# Patient Record
Sex: Female | Born: 1980 | Race: White | Hispanic: No | Marital: Single
Health system: Southern US, Community
[De-identification: ages and names within clinical notes are randomized; demographics above are authoritative.]

---

## 2018-11-01 ENCOUNTER — Emergency Department (HOSPITAL_COMMUNITY): Payer: BLUE CROSS/BLUE SHIELD

## 2018-11-01 ENCOUNTER — Encounter (HOSPITAL_COMMUNITY): Payer: Self-pay | Admitting: Emergency Medicine

## 2018-11-01 ENCOUNTER — Emergency Department (HOSPITAL_COMMUNITY)
Admission: EM | Admit: 2018-11-01 | Discharge: 2018-11-01 | Disposition: A | Discharge status: Home / Self Care | Payer: BLUE CROSS/BLUE SHIELD | Attending: Emergency Medicine | Admitting: Emergency Medicine

## 2018-11-01 ENCOUNTER — Other Ambulatory Visit: Payer: Self-pay

## 2018-11-01 DIAGNOSIS — H4711 Papilledema associated with increased intracranial pressure: Secondary | ICD-10-CM | POA: Insufficient documentation

## 2018-11-01 DIAGNOSIS — H4710 Unspecified papilledema: Secondary | ICD-10-CM

## 2018-11-01 DIAGNOSIS — G932 Benign intracranial hypertension: Secondary | ICD-10-CM

## 2018-11-01 LAB — CBC WITH DIFFERENTIAL/PLATELET
ABS IMMATURE GRANULOCYTES: 0.01 10*3/uL (ref 0.00–0.07)
BASOS PCT: 1 %
Basophils Absolute: 0 10*3/uL (ref 0.0–0.1)
EOS ABS: 0.2 10*3/uL (ref 0.0–0.5)
EOS PCT: 3 %
HCT: 38.5 % (ref 36.0–46.0)
Hemoglobin: 12.5 g/dL (ref 12.0–15.0)
Immature Granulocytes: 0 %
Lymphocytes Relative: 28 %
Lymphs Abs: 1.7 10*3/uL (ref 0.7–4.0)
MCH: 31.1 pg (ref 26.0–34.0)
MCHC: 32.5 g/dL (ref 30.0–36.0)
MCV: 95.8 fL (ref 80.0–100.0)
MONO ABS: 0.4 10*3/uL (ref 0.1–1.0)
Monocytes Relative: 6 %
NEUTROS ABS: 3.8 10*3/uL (ref 1.7–7.7)
Neutrophils Relative %: 62 %
PLATELETS: 181 10*3/uL (ref 150–400)
RBC: 4.02 MIL/uL (ref 3.87–5.11)
RDW: 10.9 % — AB (ref 11.5–15.5)
WBC: 6.1 10*3/uL (ref 4.0–10.5)
nRBC: 0 % (ref 0.0–0.2)

## 2018-11-01 LAB — COMPREHENSIVE METABOLIC PANEL
ALBUMIN: 3.5 g/dL (ref 3.5–5.0)
ALT: 46 U/L — AB (ref 0–44)
AST: 39 U/L (ref 15–41)
Alkaline Phosphatase: 40 U/L (ref 38–126)
Anion gap: 6 (ref 5–15)
BUN: 6 mg/dL (ref 6–20)
CALCIUM: 8.7 mg/dL — AB (ref 8.9–10.3)
CO2: 27 mmol/L (ref 22–32)
Chloride: 105 mmol/L (ref 98–111)
Creatinine, Ser: 0.83 mg/dL (ref 0.44–1.00)
GFR calc Af Amer: >60 mL/min (ref >60)
GFR calc non Af Amer: >60 mL/min (ref >60)
Glucose, Bld: 91 mg/dL (ref 70–99)
POTASSIUM: 3.8 mmol/L (ref 3.5–5.1)
Sodium: 138 mmol/L (ref 135–145)
TOTAL PROTEIN: 6.4 g/dL — AB (ref 6.5–8.1)
Total Bilirubin: 1.1 mg/dL (ref 0.3–1.2)

## 2018-11-01 LAB — CSF CELL COUNT WITH DIFFERENTIAL
RBC COUNT CSF: 131 /mm3 — AB
Tube #: 1
WBC CSF: 1 /mm3 (ref 0–5)

## 2018-11-01 LAB — GLUCOSE, CSF: Glucose, CSF: 52 mg/dL (ref 40–70)

## 2018-11-01 LAB — PROTEIN, CSF: Total  Protein, CSF: 15 mg/dL (ref 15–45)

## 2018-11-01 LAB — SEDIMENTATION RATE: Sed Rate: 29 mm/hr — ABNORMAL HIGH (ref 0–22)

## 2018-11-01 MED ORDER — LORAZEPAM 2 MG/ML IJ SOLN
1.0000 mg | Freq: Once | INTRAMUSCULAR | Status: AC
  Administered 2018-11-01: 1 mg via INTRAVENOUS
  Filled 2018-11-01: qty 1

## 2018-11-01 MED ORDER — ONDANSETRON 4 MG PO TBDP: 4.0000 mg | ORAL_TABLET | ORAL | 0 refills | Status: AC | PRN

## 2018-11-01 MED ORDER — HYDROCODONE-ACETAMINOPHEN 5-325 MG PO TABS: 1.0000 | ORAL_TABLET | Freq: Four times a day (QID) | ORAL | 0 refills | Status: AC | PRN

## 2018-11-01 MED ORDER — GADOBUTROL 1 MMOL/ML IV SOLN
10.0000 mL | Freq: Once | INTRAVENOUS | Status: AC | PRN
  Administered 2018-11-01: 10 mL via INTRAVENOUS

## 2018-11-01 MED ORDER — SODIUM CHLORIDE 0.9 % IV SOLN: INTRAVENOUS | Status: DC

## 2018-11-01 MED ORDER — LIDOCAINE HCL (PF) 1 % IJ SOLN
INTRAMUSCULAR | Status: AC
  Administered 2018-11-01: 10 mL via SUBCUTANEOUS
  Filled 2018-11-01: qty 5

## 2018-11-01 NOTE — ED Notes (Signed)
Pt only complains of her eyes getting tired toward the end of the day

## 2018-11-01 NOTE — ED Notes (Signed)
Pt back from LP procedure

## 2018-11-01 NOTE — ED Provider Notes (Signed)
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  She is now pending a LP by diagnostic radiology for opening pressure.  Neurology has been consulted.  No plan at this time for formal consultation.  To recontact if questions or concerns. Physical Exam  BP (!) 108/45 (BP Location: Right Arm)   Pulse 90   Temp 99.5 F (37.5 C) (Oral)   Resp (!) 24   Ht 5\' 3"  (1.6 m)   Wt 113.4 kg   LMP 11/01/2018   SpO2 99%   BMI 44.29 kg/m   Physical Exam  ED Course/Procedures   Clinical Course as of Nov 01 2113  Thu Nov 01, 2018  2111 Patient has had lumbar puncture with radiology.  Opening pressure measured at 23 cm.  26 mL taken off with closing pressure of 6 cm.  Patient reports that she has slight pressure headache behind her forehead.  Mental status is normal.  No visual changes.  Will discuss opening pressure and disposition with neurology.   [MP]    Clinical Course User Index [MP] Arby Barrette, MD    Procedures Consult: Reviewed with Dr. Otelia Limes.  We reviewed the history of present illness and the diagnostic evaluation.  He advises that given patient's intermediate opening pressure it is unclear if she will definitively have pseudotumor.  Patient was asymptomatic before evaluation.  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  At this time he advises against empirically starting Diamox as patient's intracranial pressure has been taken down to 6 cm of water and patient has pre-existing history of baseline hypotension.  He does advise that it is imperative the patient has close monitoring by ophthalmology.  She will require a recheck within 2 weeks.  She will subsequently require serial exams every 1 to 2 months for monitoring of papilledema.  Also she will need Goldmann perimetry monitoring.  He advises at this time, she would probably develop worsening headache with Diamox.  Immediate follow-up with neurology is not  necessary.  At discharge, patient is alert and well in appearance.  Mental status is clear.  No signs of distress.  No visual changes. MDM         Arby Barrette, MD 11/01/18 2144

## 2018-11-01 NOTE — ED Triage Notes (Signed)
Pt from [facility]. Pt went for her yearly eye exam and pt doctor noticed two masses behind both eyes. Pt sent her for further evaluation.

## 2018-11-01 NOTE — ED Notes (Signed)
Patient transported to MRI 

## 2018-11-01 NOTE — Discharge Instructions (Signed)
1.  Remain flat and rest for the next 12 hours.  He may get up to go to the bathroom and meet your basic needs.  Try to stay well-hydrated.  Drink plenty of fluids.  You may take ibuprofen every 8 hours and Vicodin 1 to 2 tablets as needed for control of pain.  Take Zofran if needed for nausea. 2.  It is very important that you have close and frequent follow-up with your ophthalmologist.  The neurologist has advised that you need your first follow-up appointment with ophthalmology within 2 weeks for reexamination of your optic disks.  He advises you will need serial exams every 1 to 2 months for a period of time to make sure that the findings are stable.  You will need testing called Goldman perimetry to make sure there are no subtle changes occurring that could cause loss of vision.  The neurologist has emphasized that this ophthalmologic monitoring and follow-up is vitally important to avoiding complications that can occur with increased intracranial pressures. 3.  Return to the emergency department immediately if you have problems with your vision, you are incoordinated in your gait or feel like he will fall, worsening headache or other concerning symptoms.

## 2018-11-01 NOTE — ED Notes (Signed)
Patient verbalizes understanding of discharge instructions. Opportunity for questioning and answers were provided. Armband removed by staff, pt discharged from ED via wheelchair to home.  

## 2018-11-01 NOTE — ED Provider Notes (Signed)
[facility] EMERGENCY DEPARTMENT Provider Note   CSN: [id number] Arrival date & time: 11/01/18  1147     History   Chief Complaint Chief Complaint  Patient presents with  . CNS mass    HPI Sandra Mckenzie is a 37 y.o. female.  HPI   She presents for evaluation from an ophthalmologist office with recommendations for further evaluation.  Her regular eye doctor last week for routine vision check.  She was told that she needed a slight change in her prescription.  She was also told that she had papilledema of the right eye.  She was seen again yesterday by the doctor, who noticed papilledema of the left eye.  At that point she was referred to another doctor, who saw her today.  Dr. Georges Mouse, sent her to the ED for MRI imaging, and recommended if that was negative that she get an LP to confirm opening pressure to evaluate for idiopathic intracranial hypertension.  The patient denies headache, fever, chills, neck pain or back pain.  She is walking without difficulty.  She has had some nasal congestion recently and occasional "sinus headaches," that she thinks is from environmental allergies.  She denies any changes in her vision.  There are no other known modifying factors.  History reviewed. No pertinent past medical history.  There are no active problems to display for this patient.   History reviewed. No pertinent surgical history.   OB History   None      Home Medications    Prior to Admission medications   Medication Sig Start Date End Date Taking? Authorizing Provider  HYDROcodone-acetaminophen (NORCO/VICODIN) 5-325 MG tablet Take 1-2 tablets by mouth every 6 (six) hours as needed for moderate pain or severe pain. 11/01/18   Arby Barrette, MD  ondansetron (ZOFRAN ODT) 4 MG disintegrating tablet Take 1 tablet (4 mg total) by mouth every 4 (four) hours as needed for nausea or vomiting. 11/01/18   Arby Barrette, MD    Family History No family  history on file.  Social History Social History   Tobacco Use  . Smoking status: Not on file  . Smokeless tobacco: Current User    Types: Snuff  Substance Use Topics  . Alcohol use: Not Currently  . Drug use: Never     Allergies   Patient has no allergy information on record.   Review of Systems Review of Systems  All other systems reviewed and are negative.    Physical Exam Updated Vital Signs BP (!) 108/45 (BP Location: Right Arm)   Pulse 90   Temp 99.5 F (37.5 C) (Oral)   Resp (!) 24   Ht 5\' 3"  (1.6 m)   Wt 113.4 kg   LMP 11/01/2018   SpO2 99%   BMI 44.29 kg/m   Physical Exam  Constitutional: She is oriented to person, place, and time. She appears well-developed. No distress.  Overweight  HENT:  Head: Normocephalic and atraumatic.  Eyes: Conjunctivae and EOM are normal.  Mild anisocoria, right eye pupil about 4 mm and left eye about 3 mm.  Both pupils are reactive.  No hyphema, or conjunctival abnormalities.  Note that the patient states that her pupils were dilated today.  The paperwork with her does not indicate that.  Neck: Normal range of motion and phonation normal. Neck supple.  Cardiovascular: Normal rate and regular rhythm.  Pulmonary/Chest: Effort normal and breath sounds normal. She exhibits no tenderness.  Abdominal: Soft. She exhibits no distension. There is  no tenderness. There is no guarding.  Musculoskeletal: Normal range of motion.  Neurological: She is alert and oriented to person, place, and time. She exhibits normal muscle tone.  Skin: Skin is warm and dry.  Psychiatric: She has a normal mood and affect. Her behavior is normal. Judgment and thought content normal.  Nursing note and vitals reviewed.    ED Treatments / Results  Labs (all labs ordered are listed, but only abnormal results are displayed) Labs Reviewed  COMPREHENSIVE METABOLIC PANEL - Abnormal; Notable for the following components:      Result Value   Calcium 8.7 (*)      Total Protein 6.4 (*)    ALT 46 (*)    All other components within normal limits  CBC WITH DIFFERENTIAL/PLATELET - Abnormal; Notable for the following components:   RDW 10.9 (*)    All other components within normal limits  SEDIMENTATION RATE - Abnormal; Notable for the following components:   Sed Rate 29 (*)    All other components within normal limits  CSF CELL COUNT WITH DIFFERENTIAL - Abnormal; Notable for the following components:   RBC Count, CSF 131 (*)    All other components within normal limits  CSF CULTURE  GLUCOSE, CSF  PROTEIN, CSF    EKG None  Radiology Mr Laqueta Jean Wo Contrast  Result Date: 11/01/2018 CLINICAL DATA:  37 year old female referred from ophthalmology for abnormal eye exam, possibly papilledema. EXAM: MRI HEAD AND ORBITS WITHOUT AND WITH CONTRAST MRV HEAD WITHOUT CONTRAST TECHNIQUE: Multiplanar, multiecho pulse sequences of the brain and surrounding structures were obtained without and with intravenous contrast. Multiplanar, multiecho pulse sequences of the orbits and surrounding structures were obtained including fat saturation techniques, before and after intravenous contrast administration. 2D Time-of-flight MRV of the head performed without contrast. CONTRAST:  10 milliliters Gadavist COMPARISON:  None. FINDINGS: MRI HEAD FINDINGS Brain: Partially empty sella. Normal cerebral volume. No restricted diffusion to suggest acute infarction. No midline shift, mass effect, evidence of mass lesion, ventriculomegaly, extra-axial collection or acute intracranial hemorrhage. Cervicomedullary junction within normal limits. Wallace Cullens and white matter signal is within normal limits throughout the brain. No encephalomalacia or chronic cerebral blood products. No abnormal enhancement identified.  No dural thickening. Vascular: Major intracranial vascular flow voids are preserved. The distal right vertebral artery appears mildly dominant. The major dural venous sinuses are enhancing  and appear patent. There is an effaced appearance of the bilateral transverse-sigmoid sinus junction. Skull and upper cervical spine: Normal visible cervical spine. Visualized bone marrow signal is within normal limits. Other: Mastoid air cells are clear. Visible internal auditory structures appear normal. Scalp and face soft tissues appear negative. MRV FINDINGS: Preserved flow signal in the superior sagittal sinus, torcula, straight sinus, vein of Galen, internal cerebral veins, basal veins of Rosenthal. Preserved flow signal in the bilateral transverse and sigmoid sinuses, although the junction of those sinuses appears effaced bilaterally. Preserved flow signal in both IJ bulbs. No evidence of dural venous sinus thrombosis. MRI ORBITS FINDINGS Orbits: Symmetric and normal globes. No intraorbital soft tissue mass or inflammation. No optic disc pathology is evident by MRI. Both optic nerves appear within normal limits. No abnormal enhancement identified. Patent suprasellar cistern.  Normal optic chiasm. Visualized sinuses: Mild mucosal thickening, primarily in the left maxillary sinus. No sinus fluid levels. Soft tissues: Superficial periorbital soft tissues and visible deep soft tissue spaces of the face appear normal. IMPRESSION: 1. Two findings which are nonspecific but can be seen in the setting  of idiopathic intracranial hypertension (pseudotumor cerebri): partially empty sella, and effaced junctions of the transverse and sigmoid venous sinuses. 2. Otherwise normal MRI appearance of the brain. 3. Normal MRI appearance of both orbits. 4. Normal intracranial MRV. Electronically Signed   By: Odessa Fleming M.D.   On: 11/01/2018 15:46   Mr Mrv Head Wo Cm  Result Date: 11/01/2018 CLINICAL DATA:  37 year old female referred from ophthalmology for abnormal eye exam, possibly papilledema. EXAM: MRI HEAD AND ORBITS WITHOUT AND WITH CONTRAST MRV HEAD WITHOUT CONTRAST TECHNIQUE: Multiplanar, multiecho pulse sequences of the  brain and surrounding structures were obtained without and with intravenous contrast. Multiplanar, multiecho pulse sequences of the orbits and surrounding structures were obtained including fat saturation techniques, before and after intravenous contrast administration. 2D Time-of-flight MRV of the head performed without contrast. CONTRAST:  10 milliliters Gadavist COMPARISON:  None. FINDINGS: MRI HEAD FINDINGS Brain: Partially empty sella. Normal cerebral volume. No restricted diffusion to suggest acute infarction. No midline shift, mass effect, evidence of mass lesion, ventriculomegaly, extra-axial collection or acute intracranial hemorrhage. Cervicomedullary junction within normal limits. Wallace Cullens and white matter signal is within normal limits throughout the brain. No encephalomalacia or chronic cerebral blood products. No abnormal enhancement identified.  No dural thickening. Vascular: Major intracranial vascular flow voids are preserved. The distal right vertebral artery appears mildly dominant. The major dural venous sinuses are enhancing and appear patent. There is an effaced appearance of the bilateral transverse-sigmoid sinus junction. Skull and upper cervical spine: Normal visible cervical spine. Visualized bone marrow signal is within normal limits. Other: Mastoid air cells are clear. Visible internal auditory structures appear normal. Scalp and face soft tissues appear negative. MRV FINDINGS: Preserved flow signal in the superior sagittal sinus, torcula, straight sinus, vein of Galen, internal cerebral veins, basal veins of Rosenthal. Preserved flow signal in the bilateral transverse and sigmoid sinuses, although the junction of those sinuses appears effaced bilaterally. Preserved flow signal in both IJ bulbs. No evidence of dural venous sinus thrombosis. MRI ORBITS FINDINGS Orbits: Symmetric and normal globes. No intraorbital soft tissue mass or inflammation. No optic disc pathology is evident by MRI. Both  optic nerves appear within normal limits. No abnormal enhancement identified. Patent suprasellar cistern.  Normal optic chiasm. Visualized sinuses: Mild mucosal thickening, primarily in the left maxillary sinus. No sinus fluid levels. Soft tissues: Superficial periorbital soft tissues and visible deep soft tissue spaces of the face appear normal. IMPRESSION: 1. Two findings which are nonspecific but can be seen in the setting of idiopathic intracranial hypertension (pseudotumor cerebri): partially empty sella, and effaced junctions of the transverse and sigmoid venous sinuses. 2. Otherwise normal MRI appearance of the brain. 3. Normal MRI appearance of both orbits. 4. Normal intracranial MRV. Electronically Signed   By: Odessa Fleming M.D.   On: 11/01/2018 15:46   Mr Rockwell [place] ZO Contrast  Result Date: 11/01/2018 CLINICAL DATA:  37 year old female referred from ophthalmology for abnormal eye exam, possibly papilledema. EXAM: MRI HEAD AND ORBITS WITHOUT AND WITH CONTRAST MRV HEAD WITHOUT CONTRAST TECHNIQUE: Multiplanar, multiecho pulse sequences of the brain and surrounding structures were obtained without and with intravenous contrast. Multiplanar, multiecho pulse sequences of the orbits and surrounding structures were obtained including fat saturation techniques, before and after intravenous contrast administration. 2D Time-of-flight MRV of the head performed without contrast. CONTRAST:  10 milliliters Gadavist COMPARISON:  None. FINDINGS: MRI HEAD FINDINGS Brain: Partially empty sella. Normal cerebral volume. No restricted diffusion to suggest acute infarction. No midline shift, mass  effect, evidence of mass lesion, ventriculomegaly, extra-axial collection or acute intracranial hemorrhage. Cervicomedullary junction within normal limits. Wallace Cullens and white matter signal is within normal limits throughout the brain. No encephalomalacia or chronic cerebral blood products. No abnormal enhancement identified.  No dural  thickening. Vascular: Major intracranial vascular flow voids are preserved. The distal right vertebral artery appears mildly dominant. The major dural venous sinuses are enhancing and appear patent. There is an effaced appearance of the bilateral transverse-sigmoid sinus junction. Skull and upper cervical spine: Normal visible cervical spine. Visualized bone marrow signal is within normal limits. Other: Mastoid air cells are clear. Visible internal auditory structures appear normal. Scalp and face soft tissues appear negative. MRV FINDINGS: Preserved flow signal in the superior sagittal sinus, torcula, straight sinus, vein of Galen, internal cerebral veins, basal veins of Rosenthal. Preserved flow signal in the bilateral transverse and sigmoid sinuses, although the junction of those sinuses appears effaced bilaterally. Preserved flow signal in both IJ bulbs. No evidence of dural venous sinus thrombosis. MRI ORBITS FINDINGS Orbits: Symmetric and normal globes. No intraorbital soft tissue mass or inflammation. No optic disc pathology is evident by MRI. Both optic nerves appear within normal limits. No abnormal enhancement identified. Patent suprasellar cistern.  Normal optic chiasm. Visualized sinuses: Mild mucosal thickening, primarily in the left maxillary sinus. No sinus fluid levels. Soft tissues: Superficial periorbital soft tissues and visible deep soft tissue spaces of the face appear normal. IMPRESSION: 1. Two findings which are nonspecific but can be seen in the setting of idiopathic intracranial hypertension (pseudotumor cerebri): partially empty sella, and effaced junctions of the transverse and sigmoid venous sinuses. 2. Otherwise normal MRI appearance of the brain. 3. Normal MRI appearance of both orbits. 4. Normal intracranial MRV. Electronically Signed   By: Odessa Fleming M.D.   On: 11/01/2018 15:46   Dg Lumbar Puncture Fluoro Guide  Result Date: 11/01/2018 CLINICAL DATA:  Lumbar puncture for papilledema.  EXAM: DIAGNOSTIC LUMBAR PUNCTURE UNDER FLUOROSCOPIC GUIDANCE FLUOROSCOPY TIME:  Fluoroscopy Time:  0 minutes and 18 seconds Radiation Exposure Index (if provided by the fluoroscopic device): 169.4 micro Gy per meter squared. Number of Acquired Spot Images: 1 PROCEDURE: Informed consent was obtained from the patient prior to the procedure, including potential complications of headache, allergy, and pain. With the patient prone, the lower back was prepped with Betadine. 1% Lidocaine was used for local anesthesia. Lumbar puncture was performed at the L3-L4 level using a 20 gauge, 6 cm needle with return of clear CSF with an opening pressure of 23 cm water. 26.5 ml of CSF were obtained for laboratory studies and to remove CSF. The closing pressure was 6 cm of water. The patient tolerated the procedure well and there were no apparent complications. IMPRESSION: 1. Successful fluoroscopically guided lumbar puncture. 2. Opening pressure was 23 cm of water. Normal range 13-20 cm of water (10-15 mm mercury). Twenty-six cm water diagnostic of intracranial hypertension. Current pressure is above the expected normal range, but below the diagnostic level for intracranial hypertension. Pressure also decreased to well within the normal range, 6 cm of water, after removing 26.5 mL of CSF. Electronically Signed   By: Amie Portland M.D.   On: 11/01/2018 20:49    Procedures .Critical Care Performed by: Mancel Bale, MD Authorized by: Mancel Bale, MD   Critical care provider statement:    Critical care time (minutes):  35   Critical care start time:  11/01/2018 12:10 PM   Critical care end time:  11/01/2018 5:15  PM   Critical care time was exclusive of:  Separately billable procedures and treating other patients   Critical care was time spent personally by me on the following activities:  Blood draw for specimens, development of treatment plan with patient or surrogate, discussions with consultants, evaluation of  patient's response to treatment, examination of patient, obtaining history from patient or surrogate, ordering and performing treatments and interventions, ordering and review of laboratory studies, pulse oximetry, re-evaluation of patient's condition, review of old charts and ordering and review of radiographic studies   (including critical care time)  Medications Ordered in ED Medications  LORazepam (ATIVAN) injection 1 mg (1 mg Intravenous Given 11/01/18 1331)  gadobutrol (GADAVIST) 1 MMOL/ML injection 10 mL (10 mLs Intravenous Contrast Given 11/01/18 1512)  lidocaine (PF) (XYLOCAINE) 1 % injection (10 mLs Subcutaneous Given 11/01/18 2039)     Initial Impression / Assessment and Plan / ED Course  I have reviewed the triage vital signs and the nursing notes.  Pertinent labs & imaging results that were available during my care of the patient were reviewed by me and considered in my medical decision making (see chart for details).  Clinical Course as of Nov 03 1351  Thu Nov 01, 2018  2111 Patient has had lumbar puncture with radiology.  Opening pressure measured at 23 cm.  26 mL taken off with closing pressure of 6 cm.  Patient reports that she has slight pressure headache behind her forehead.  Mental status is normal.  No visual changes.  Will discuss opening pressure and disposition with neurology.   [MP]    Clinical Course User Index [MP] Arby Barrette, MD     No data found.   Medical Decision Making: Patient presenting with papilledema diagnosed as an outpatient by ophthalmology.  Screening for intracranial lesions, with MRI and MRV, negative.  Patient required LP under fluoroscopy for diagnosis of possible intracranial hypertension.  CRITICAL CARE-yes Performed by: Mancel Bale  Nursing Notes Reviewed/ Care Coordinated Applicable Imaging Reviewed Interpretation of Laboratory Data incorporated into ED treatment   Plan-as per oncoming provider team following lumbar puncture  and discussion with neuro hospitalist regarding findings.  Final Clinical Impressions(s) / ED Diagnoses   Final diagnoses:  Papilledema  Elevated intracranial pressure    ED Discharge Orders         Ordered    ondansetron (ZOFRAN ODT) 4 MG disintegrating tablet  Every 4 hours PRN     11/01/18 2134    HYDROcodone-acetaminophen (NORCO/VICODIN) 5-325 MG tablet  Every 6 hours PRN     11/01/18 2134           Mancel Bale, MD 11/02/18 1406

## 2018-11-05 LAB — CSF CULTURE: CULTURE: NO GROWTH

## 2020-03-13 IMAGING — MR MR ORBITS WO/W CM
1 series · 48 of 48 positions shown · IV contrast (gadavist)
Comparison: None.

CLINICAL DATA: 36-year-old female referred from ophthalmology for
abnormal eye exam, possibly papilledema.

EXAM:
MRI HEAD AND ORBITS WITHOUT AND WITH CONTRAST
MRV HEAD WITHOUT CONTRAST
TECHNIQUE: Multiplanar, multiecho pulse sequences of the brain and surrounding
structures were obtained without and with intravenous contrast.
Multiplanar, multiecho pulse sequences of the orbits and surrounding
structures were obtained including fat saturation techniques, before
and after intravenous contrast administration.
2D Time-of-flight MRV of the head performed without contrast.
CONTRAST:  10 milliliters Gadavist

[Series 3: DWI · axial · 3.0mm · 0.94mm/px · z∈[-84,+62]mm · 48 of 48 slices shown]
[im 1/48]
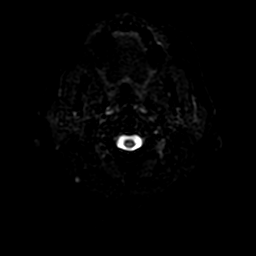
[im 2/48]
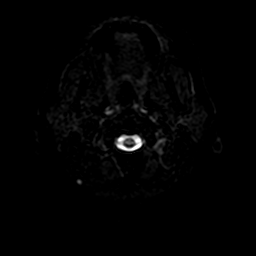
[im 3/48]
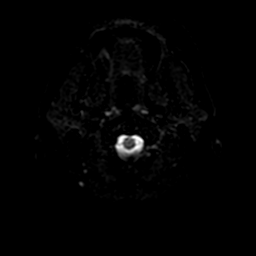
[im 4/48]
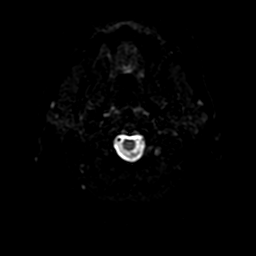
[im 5/48]
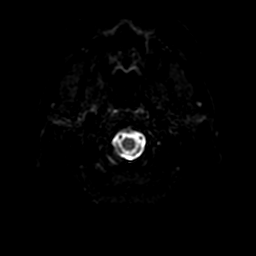
[im 6/48]
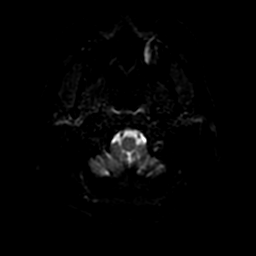
[im 7/48]
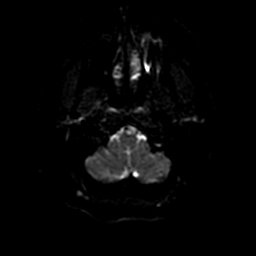
[im 8/48]
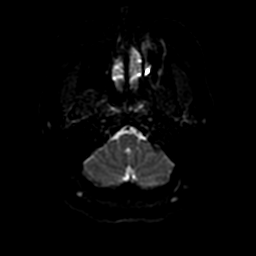
[im 9/48]
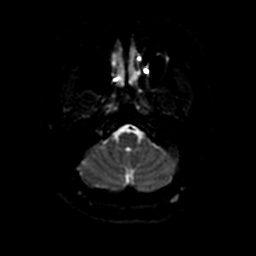
[im 10/48]
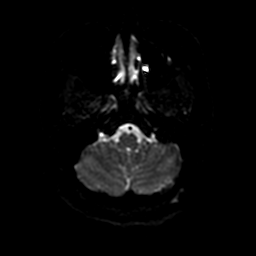
[im 11/48]
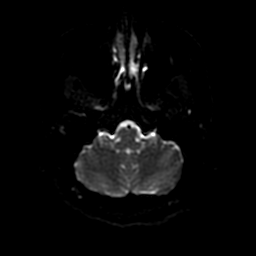
[im 12/48]
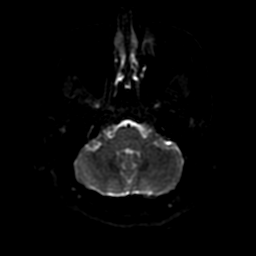
[im 13/48]
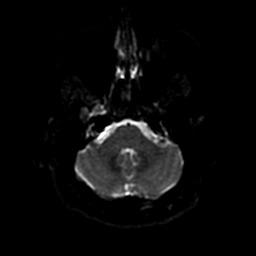
[im 14/48]
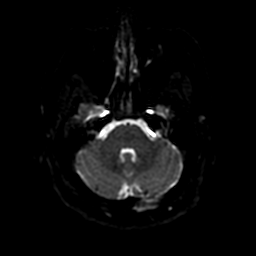
[im 15/48]
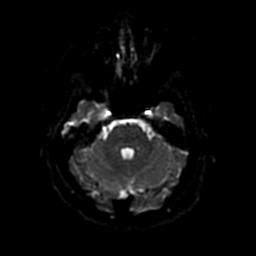
[im 16/48]
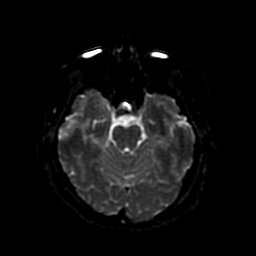
[im 17/48]
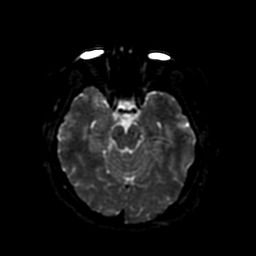
[im 18/48]
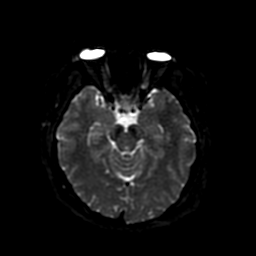
[im 19/48]
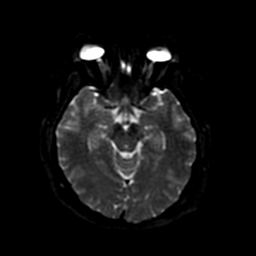
[im 20/48]
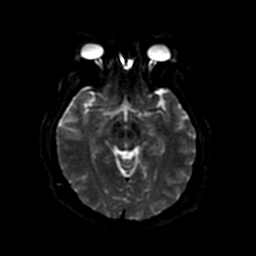
[im 21/48]
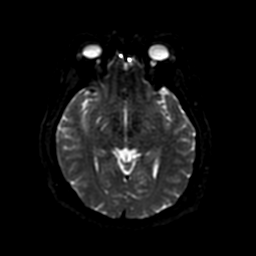
[im 22/48]
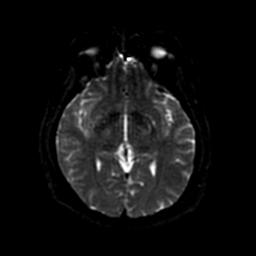
[im 23/48]
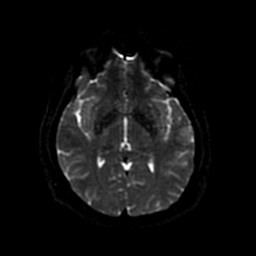
[im 24/48]
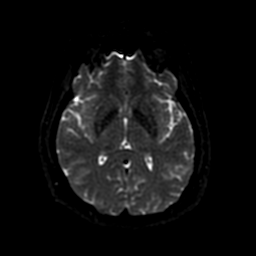
[im 25/48]
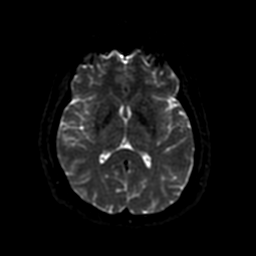
[im 26/48]
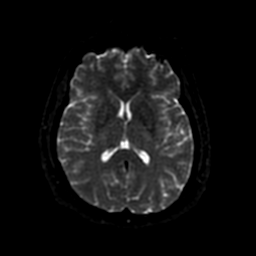
[im 27/48]
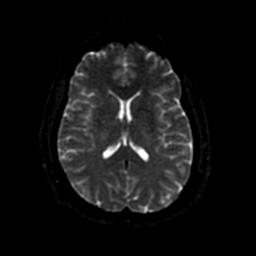
[im 28/48]
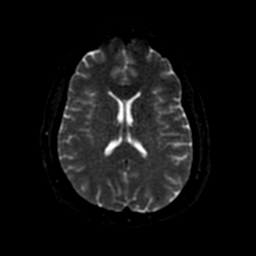
[im 29/48]
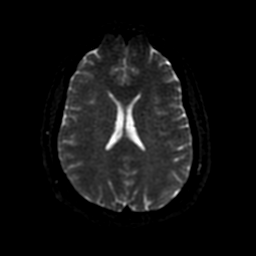
[im 30/48]
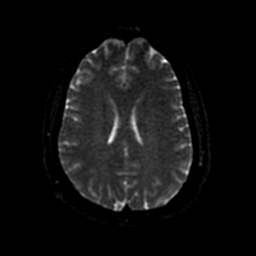
[im 31/48]
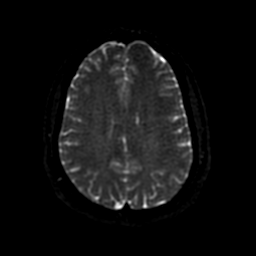
[im 32/48]
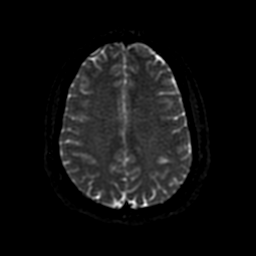
[im 33/48]
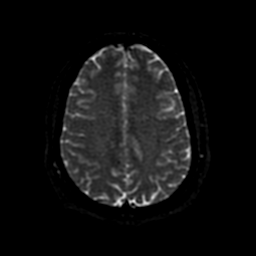
[im 34/48]
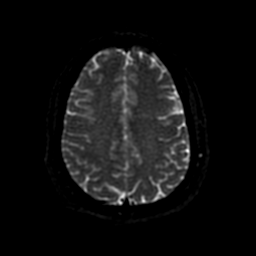
[im 35/48]
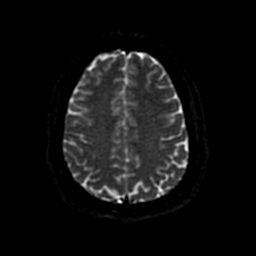
[im 36/48]
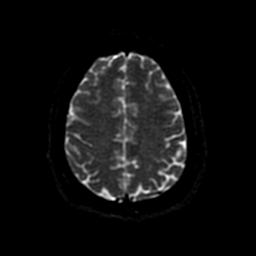
[im 37/48]
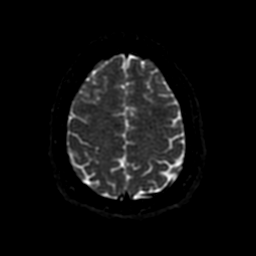
[im 38/48]
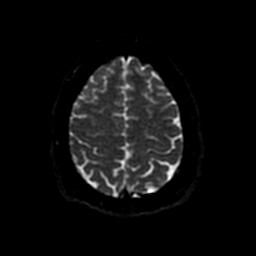
[im 39/48]
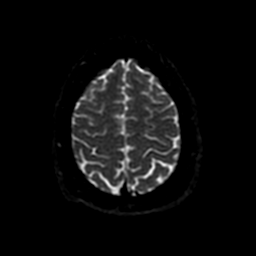
[im 40/48]
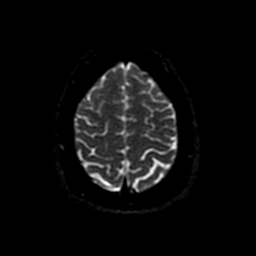
[im 41/48]
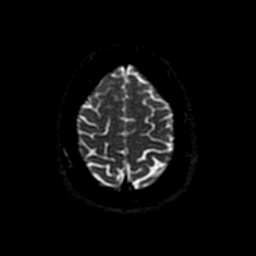
[im 42/48]
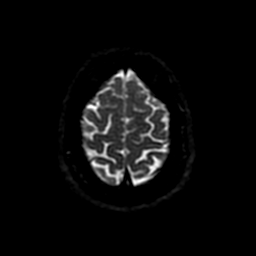
[im 43/48]
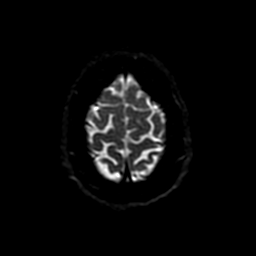
[im 44/48]
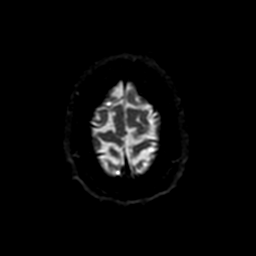
[im 45/48]
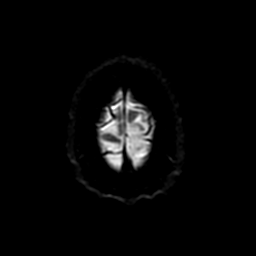
[im 46/48]
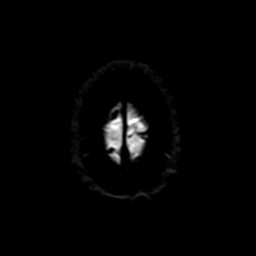
[im 47/48]
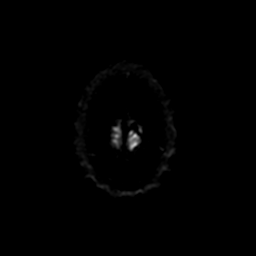
[im 48/48]
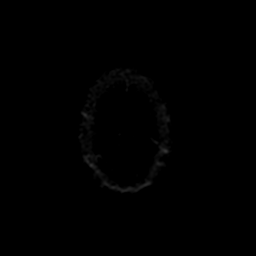

[48 of 48 positions shown; findings below may reference images not displayed]

FINDINGS: MRI HEAD FINDINGS

Brain: Partially empty sella.

Normal cerebral volume. No restricted diffusion to suggest acute
infarction. No midline shift, mass effect, evidence of mass lesion,
ventriculomegaly, extra-axial collection or acute intracranial
hemorrhage. Cervicomedullary junction within normal limits.

Gray and white matter signal is within normal limits throughout the
brain. No encephalomalacia or chronic cerebral blood products.

No abnormal enhancement identified.  No dural thickening.

Vascular: Major intracranial vascular flow voids are preserved. The
distal right vertebral artery appears mildly dominant.
The major dural venous sinuses are enhancing and appear patent.
There is an effaced appearance of the bilateral transverse-sigmoid
sinus junction.

Skull and upper cervical spine: Normal visible cervical spine.
Visualized bone marrow signal is within normal limits.

Other: Mastoid air cells are clear. Visible internal auditory
structures appear normal. Scalp and face soft tissues appear
negative.

MRV FINDINGS:

Preserved flow signal in the superior sagittal sinus, torcula,
straight sinus, vein of [place], internal cerebral veins, basal veins
of [place]. Preserved flow signal in the bilateral transverse and
sigmoid sinuses, although the junction of those sinuses appears
effaced bilaterally. Preserved flow signal in both IJ bulbs.

No evidence of dural venous sinus thrombosis.

MRI ORBITS FINDINGS

Orbits: Symmetric and normal globes. No intraorbital soft tissue
mass or inflammation. No optic disc pathology is evident by MRI.
Both optic nerves appear within normal limits. No abnormal
enhancement identified.

Patent suprasellar cistern.  Normal optic chiasm.

Visualized sinuses: Mild mucosal thickening, primarily in the left
maxillary sinus. No sinus fluid levels.

Soft tissues: Superficial periorbital soft tissues and visible deep
soft tissue spaces of the face appear normal.
IMPRESSION: 1. Two findings which are nonspecific but can be seen in the setting
of idiopathic intracranial hypertension (pseudotumor cerebri):
partially empty sella, and effaced junctions of the transverse and
sigmoid venous sinuses.
2. Otherwise normal MRI appearance of the brain.
3. Normal MRI appearance of both orbits.
4. Normal intracranial MRV.

## 2024-01-16 ENCOUNTER — Other Ambulatory Visit (HOSPITAL_BASED_OUTPATIENT_CLINIC_OR_DEPARTMENT_OTHER): Payer: Self-pay
